# Patient Record
Sex: Female | Born: 1995 | Hispanic: Yes | Marital: Single
Health system: Southern US, Community
[De-identification: ages and names within clinical notes are randomized; demographics above are authoritative.]

## PROBLEM LIST (undated history)

## (undated) ENCOUNTER — Emergency Department: Admission: EM | Payer: Self-pay | Source: Home / Self Care

---

## 2015-08-12 ENCOUNTER — Emergency Department: Payer: Self-pay

## 2015-08-12 ENCOUNTER — Other Ambulatory Visit: Payer: Self-pay | Admitting: Family Medicine

## 2015-08-12 ENCOUNTER — Ambulatory Visit
Admission: RE | Admit: 2015-08-12 | Discharge: 2015-08-12 | Disposition: A | Discharge status: Home / Self Care | Payer: 59 | Source: Ambulatory Visit | Attending: Family Medicine | Admitting: Family Medicine

## 2015-08-12 ENCOUNTER — Ambulatory Visit
Admission: RE | Admit: 2015-08-12 | Discharge: 2015-08-12 | Disposition: A | Discharge status: Home / Self Care | Payer: 59 | Source: Other Acute Inpatient Hospital | Attending: Family Medicine | Admitting: Family Medicine

## 2015-08-12 DIAGNOSIS — Y9361 Activity, american tackle football: Secondary | ICD-10-CM | POA: Insufficient documentation

## 2015-08-12 DIAGNOSIS — T1490XA Injury, unspecified, initial encounter: Secondary | ICD-10-CM

## 2015-08-12 DIAGNOSIS — S62625A Displaced fracture of medial phalanx of left ring finger, initial encounter for closed fracture: Secondary | ICD-10-CM | POA: Insufficient documentation

## 2015-08-12 DIAGNOSIS — X58XXXA Exposure to other specified factors, initial encounter: Secondary | ICD-10-CM | POA: Diagnosis not present

## 2015-08-12 DIAGNOSIS — S6992XA Unspecified injury of left wrist, hand and finger(s), initial encounter: Secondary | ICD-10-CM | POA: Diagnosis present

## 2015-08-12 NOTE — ED Notes (Signed)
Pt was playing soccer and injured left 3rd finger and also has hand pain.

## 2016-09-09 IMAGING — CR DG FINGER RING 2+V*L*
1 series · 3 of 3 positions shown · non-contrast
Comparison: None.

CLINICAL DATA: Left ring finger injury.

EXAM:
LEFT RING FINGER 2+V

[Series 1: x finger pa left · 0.14mm/px · 3 of 3 slices shown]
[im 1/3]
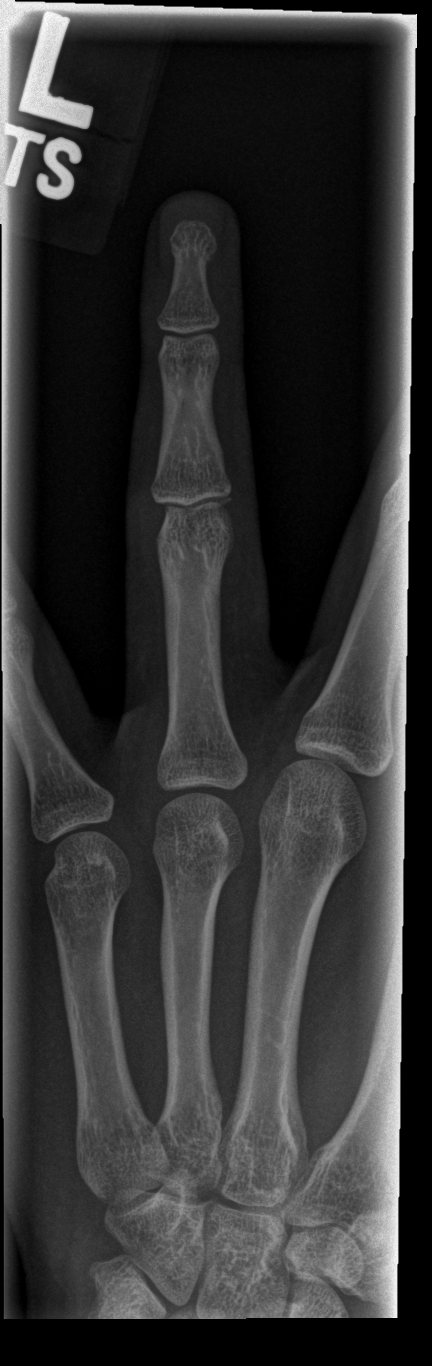
[im 2/3]
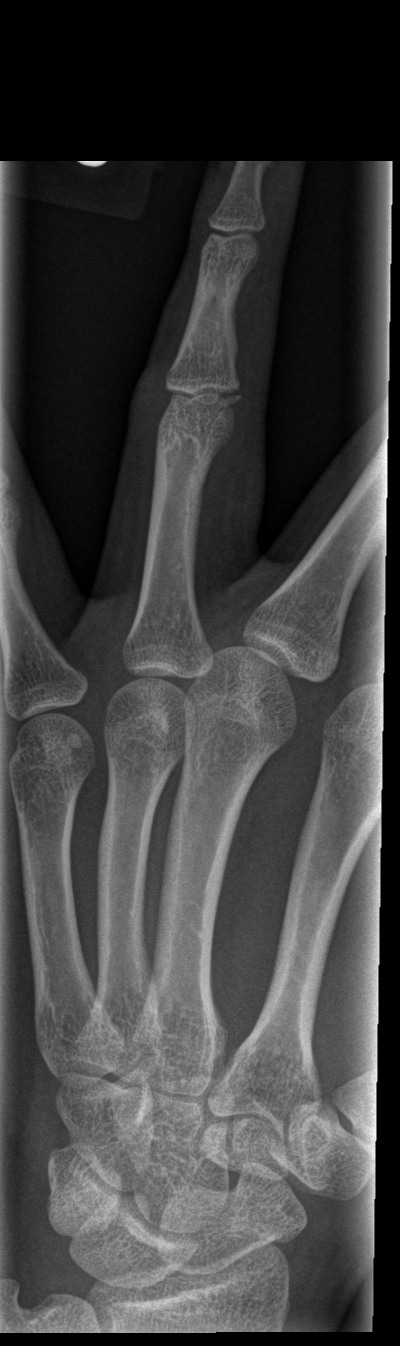
[im 3/3]
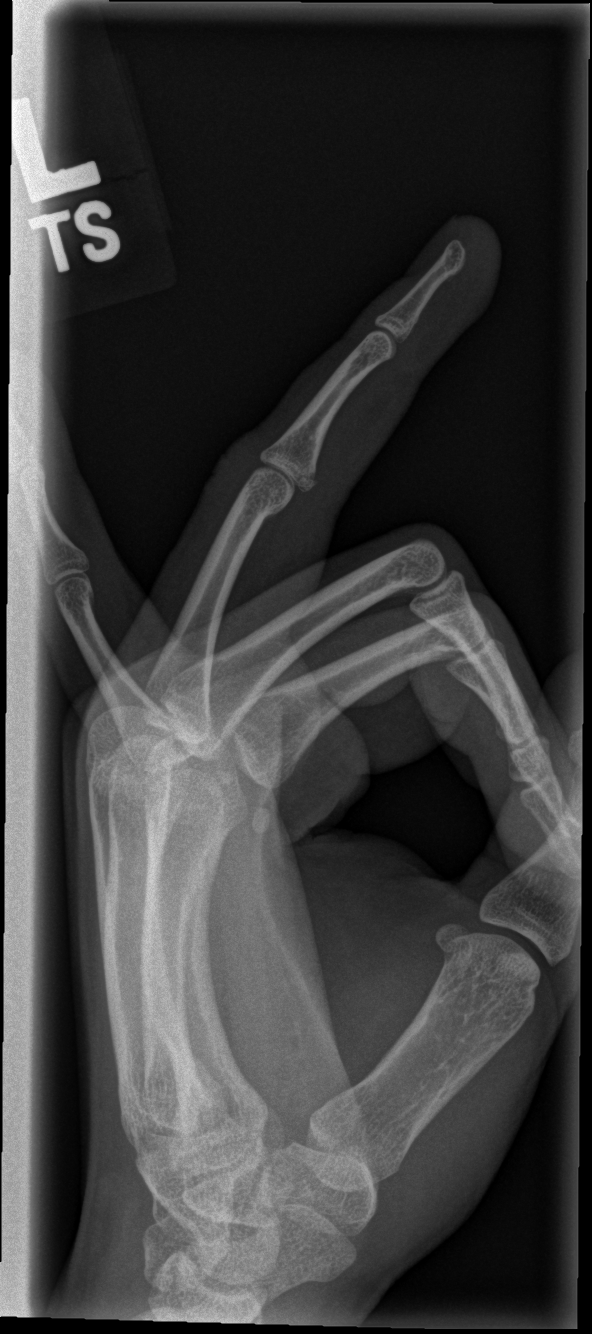

[3 of 3 positions shown; findings below may reference images not displayed]

FINDINGS: Fracture of the base of the middle phalanx of the left fourth digit
noted. Fracture slightly displaced. Fracture extends into the
proximal interphalangeal joint space. No other focal abnormality
identified.
IMPRESSION: Slight displaced fracture of the base of the middle phalanx of the
left fourth digit . Fracture extends into the proximal
interphalangeal joint space.
